# Patient Record
Sex: Female | Born: 1965 | Race: White | Hispanic: No | Marital: Married | Smoking: Never smoker
Health system: Southern US, Community
[De-identification: ages and names within clinical notes are randomized; demographics above are authoritative.]

---

## 2005-11-14 ENCOUNTER — Other Ambulatory Visit: Admission: RE | Admit: 2005-11-14 | Discharge: 2005-11-14 | Payer: Self-pay | Admitting: Obstetrics and Gynecology

## 2006-03-03 ENCOUNTER — Encounter: Admission: RE | Admit: 2006-03-03 | Discharge: 2006-03-03 | Payer: Self-pay | Admitting: Internal Medicine

## 2007-03-12 ENCOUNTER — Encounter: Admission: RE | Admit: 2007-03-12 | Discharge: 2007-03-12 | Payer: Self-pay | Admitting: Obstetrics and Gynecology

## 2008-03-19 ENCOUNTER — Encounter: Admission: RE | Admit: 2008-03-19 | Discharge: 2008-03-19 | Payer: Self-pay | Admitting: Obstetrics and Gynecology

## 2009-03-30 ENCOUNTER — Encounter: Admission: RE | Admit: 2009-03-30 | Discharge: 2009-03-30 | Payer: Self-pay | Admitting: Obstetrics and Gynecology

## 2010-04-15 ENCOUNTER — Encounter: Admission: RE | Admit: 2010-04-15 | Discharge: 2010-04-15 | Payer: Self-pay | Admitting: Obstetrics and Gynecology

## 2010-04-22 ENCOUNTER — Encounter: Admission: RE | Admit: 2010-04-22 | Discharge: 2010-04-22 | Payer: Self-pay | Admitting: Obstetrics and Gynecology

## 2011-04-18 ENCOUNTER — Other Ambulatory Visit: Payer: Self-pay | Admitting: Internal Medicine

## 2011-04-18 DIAGNOSIS — Z1231 Encounter for screening mammogram for malignant neoplasm of breast: Secondary | ICD-10-CM

## 2011-04-26 ENCOUNTER — Ambulatory Visit
Admission: RE | Admit: 2011-04-26 | Discharge: 2011-04-26 | Disposition: A | Discharge status: Home / Self Care | Payer: 59 | Source: Ambulatory Visit | Attending: Internal Medicine | Admitting: Internal Medicine

## 2011-04-26 DIAGNOSIS — Z1231 Encounter for screening mammogram for malignant neoplasm of breast: Secondary | ICD-10-CM

## 2011-10-25 ENCOUNTER — Other Ambulatory Visit: Payer: Self-pay

## 2012-05-14 ENCOUNTER — Other Ambulatory Visit: Payer: Self-pay | Admitting: Internal Medicine

## 2012-05-14 DIAGNOSIS — Z1231 Encounter for screening mammogram for malignant neoplasm of breast: Secondary | ICD-10-CM

## 2012-06-07 ENCOUNTER — Ambulatory Visit
Admission: RE | Admit: 2012-06-07 | Discharge: 2012-06-07 | Disposition: A | Discharge status: Home / Self Care | Payer: 59 | Source: Ambulatory Visit | Attending: Internal Medicine | Admitting: Internal Medicine

## 2012-06-07 DIAGNOSIS — Z1231 Encounter for screening mammogram for malignant neoplasm of breast: Secondary | ICD-10-CM

## 2012-08-27 ENCOUNTER — Other Ambulatory Visit (HOSPITAL_COMMUNITY): Payer: Self-pay | Admitting: Gastroenterology

## 2012-08-27 DIAGNOSIS — K859 Acute pancreatitis without necrosis or infection, unspecified: Secondary | ICD-10-CM

## 2012-08-28 ENCOUNTER — Ambulatory Visit (HOSPITAL_COMMUNITY): Payer: 59

## 2012-11-20 ENCOUNTER — Other Ambulatory Visit: Payer: Self-pay

## 2013-05-07 ENCOUNTER — Other Ambulatory Visit: Payer: Self-pay

## 2013-05-07 DIAGNOSIS — Z1231 Encounter for screening mammogram for malignant neoplasm of breast: Secondary | ICD-10-CM

## 2013-06-10 ENCOUNTER — Ambulatory Visit: Payer: 59

## 2013-06-11 ENCOUNTER — Ambulatory Visit: Payer: 59

## 2013-06-14 ENCOUNTER — Ambulatory Visit
Admission: RE | Admit: 2013-06-14 | Discharge: 2013-06-14 | Disposition: A | Discharge status: Home / Self Care | Payer: 59 | Source: Ambulatory Visit

## 2013-06-14 DIAGNOSIS — Z1231 Encounter for screening mammogram for malignant neoplasm of breast: Secondary | ICD-10-CM

## 2013-06-19 ENCOUNTER — Other Ambulatory Visit: Payer: Self-pay | Admitting: Internal Medicine

## 2013-06-19 DIAGNOSIS — R928 Other abnormal and inconclusive findings on diagnostic imaging of breast: Secondary | ICD-10-CM

## 2013-07-04 ENCOUNTER — Ambulatory Visit
Admission: RE | Admit: 2013-07-04 | Discharge: 2013-07-04 | Disposition: A | Discharge status: Home / Self Care | Payer: 59 | Source: Ambulatory Visit | Attending: Internal Medicine | Admitting: Internal Medicine

## 2013-07-04 DIAGNOSIS — R928 Other abnormal and inconclusive findings on diagnostic imaging of breast: Secondary | ICD-10-CM

## 2014-07-16 ENCOUNTER — Other Ambulatory Visit: Payer: Self-pay

## 2014-07-16 DIAGNOSIS — Z1231 Encounter for screening mammogram for malignant neoplasm of breast: Secondary | ICD-10-CM

## 2014-08-06 ENCOUNTER — Ambulatory Visit
Admission: RE | Admit: 2014-08-06 | Discharge: 2014-08-06 | Disposition: A | Discharge status: Home / Self Care | Payer: BC Managed Care – PPO | Source: Ambulatory Visit

## 2014-08-06 ENCOUNTER — Ambulatory Visit: Payer: 59

## 2014-08-06 DIAGNOSIS — Z1231 Encounter for screening mammogram for malignant neoplasm of breast: Secondary | ICD-10-CM

## 2015-09-11 ENCOUNTER — Other Ambulatory Visit: Payer: Self-pay

## 2015-09-11 DIAGNOSIS — Z1231 Encounter for screening mammogram for malignant neoplasm of breast: Secondary | ICD-10-CM

## 2015-10-05 ENCOUNTER — Ambulatory Visit
Admission: RE | Admit: 2015-10-05 | Discharge: 2015-10-05 | Disposition: A | Discharge status: Home / Self Care | Payer: BLUE CROSS/BLUE SHIELD | Source: Ambulatory Visit

## 2015-10-05 DIAGNOSIS — Z1231 Encounter for screening mammogram for malignant neoplasm of breast: Secondary | ICD-10-CM

## 2015-10-07 ENCOUNTER — Other Ambulatory Visit: Payer: Self-pay | Admitting: Internal Medicine

## 2015-10-07 DIAGNOSIS — R928 Other abnormal and inconclusive findings on diagnostic imaging of breast: Secondary | ICD-10-CM

## 2015-10-13 ENCOUNTER — Ambulatory Visit
Admission: RE | Admit: 2015-10-13 | Discharge: 2015-10-13 | Disposition: A | Discharge status: Home / Self Care | Payer: BLUE CROSS/BLUE SHIELD | Source: Ambulatory Visit | Attending: Internal Medicine | Admitting: Internal Medicine

## 2015-10-13 DIAGNOSIS — R928 Other abnormal and inconclusive findings on diagnostic imaging of breast: Secondary | ICD-10-CM

## 2017-08-28 IMAGING — MG MM DIAG BREAST TOMO UNI RIGHT
6 series · 6 of 18 positions shown · non-contrast
Comparison: Previous exam(s).

CLINICAL DATA: Screening recall for a possible right breast mass
and a possible distortion.

EXAM:
DIGITAL DIAGNOSTIC RIGHT MAMMOGRAM WITH 3D TOMOSYNTHESIS WITH CAD
ULTRASOUND RIGHT BREAST

[R MLO]
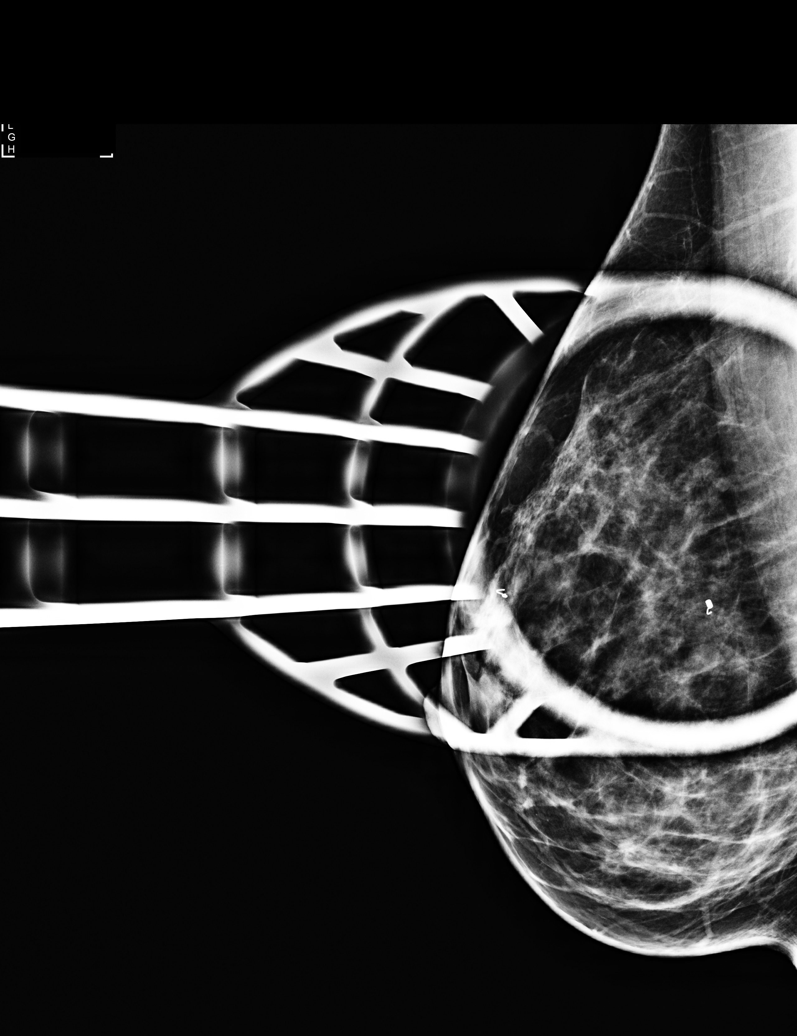

[R CC]
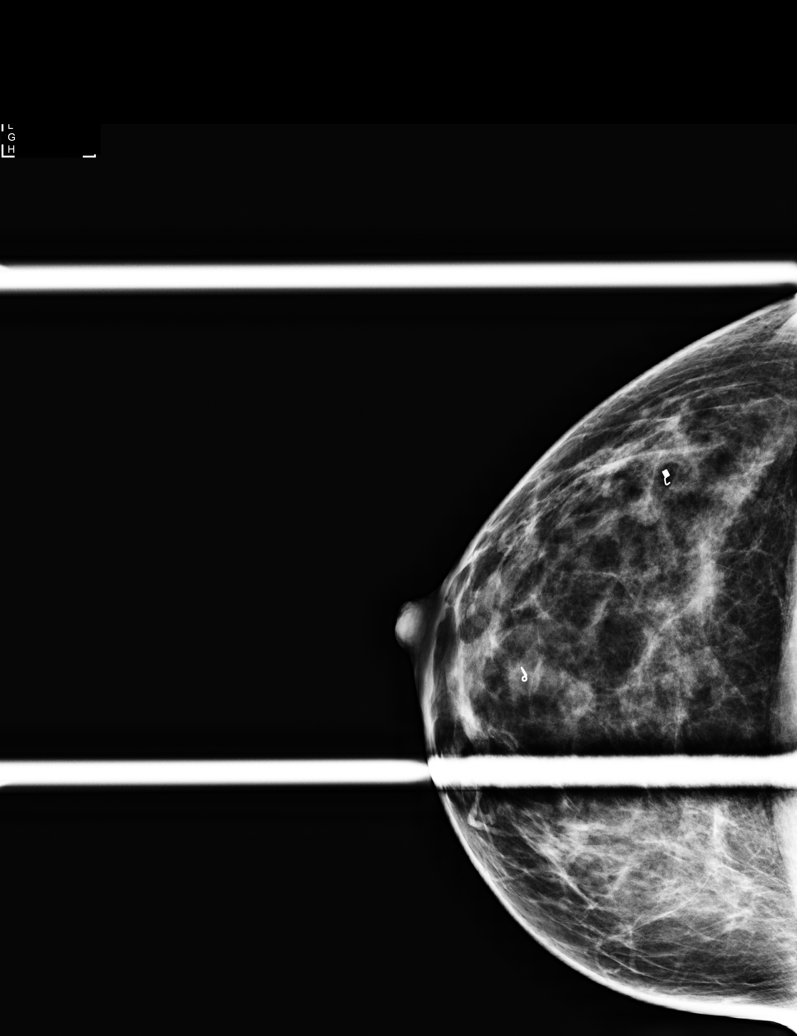

[R ML]
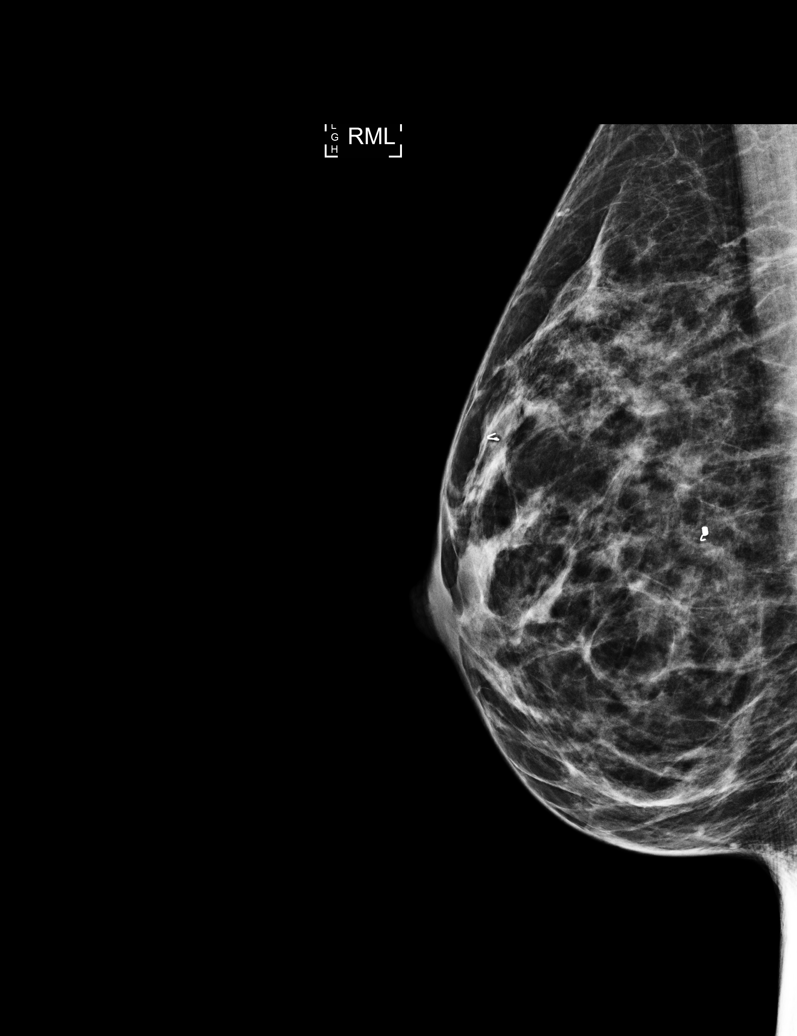

[R CC tomo · tomo slice 29/56.0]
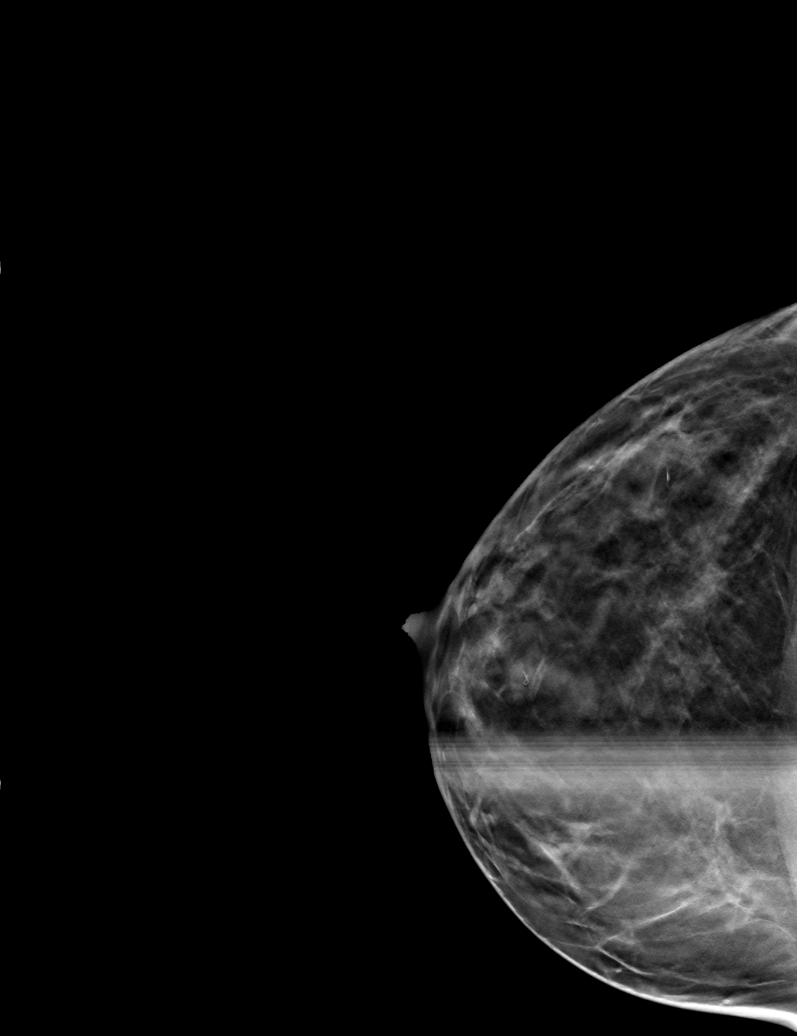

[R ML tomo · tomo slice 24/47.0]
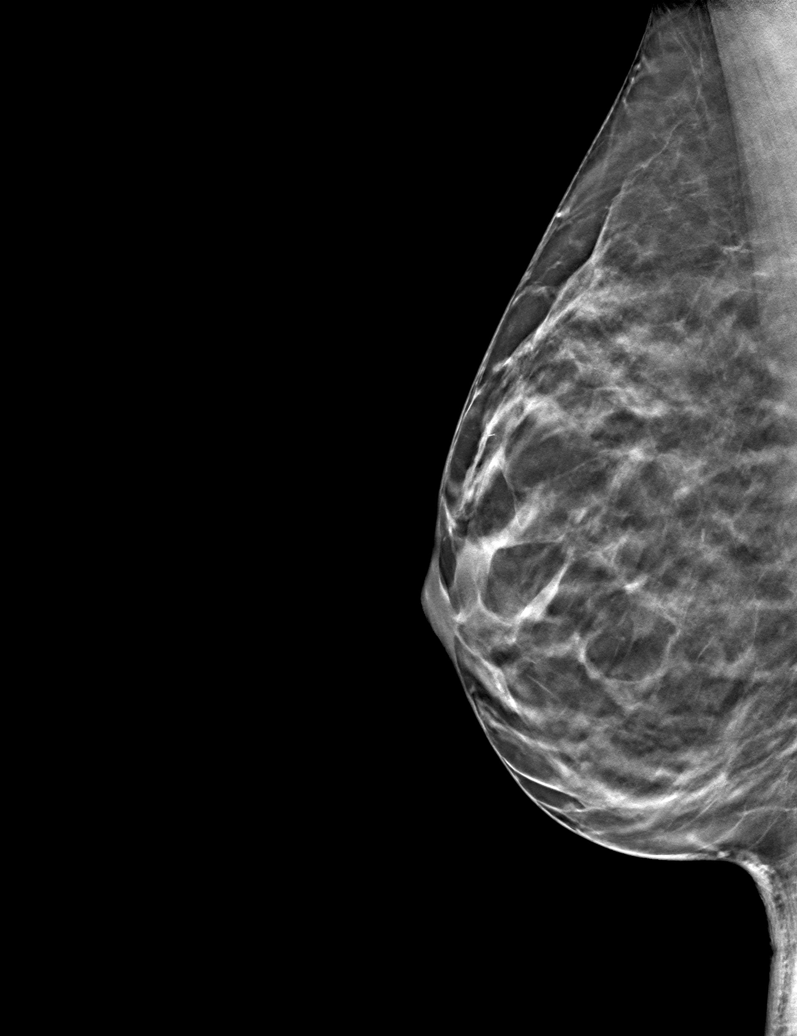

[R MLO tomo · tomo slice 27/53.0]
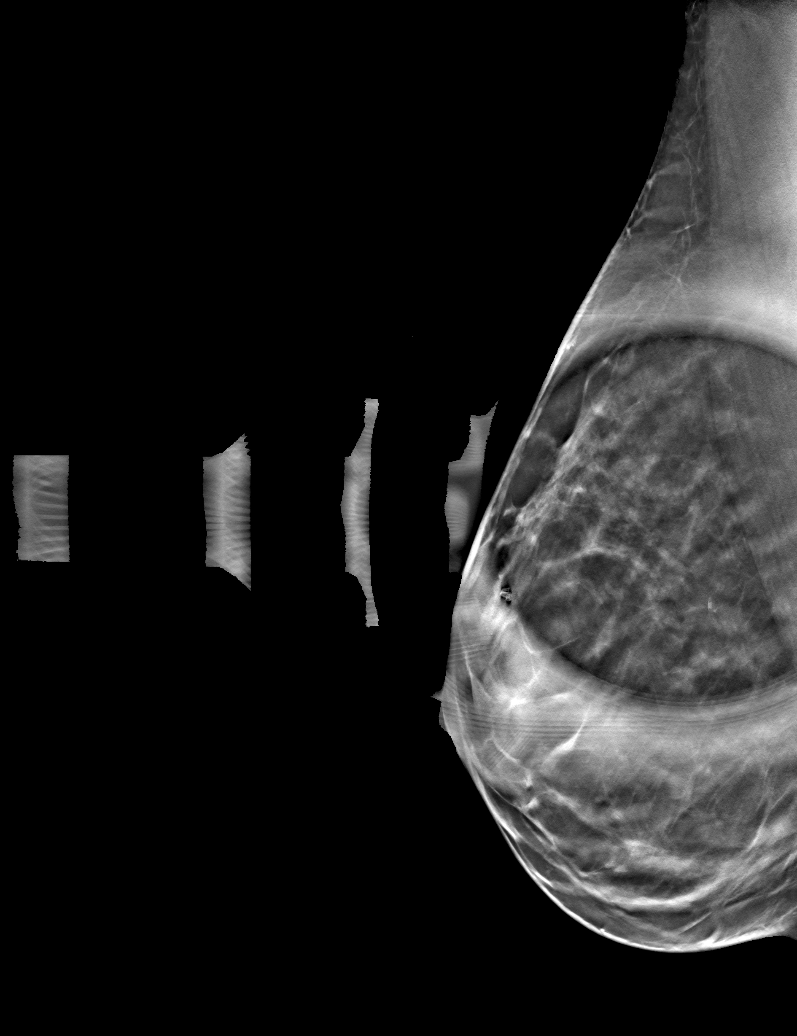

[6 of 18 positions shown; findings below may reference images not displayed]

ACR Breast Density Category c: The breast tissue is heterogeneously
dense, which may obscure small masses.
FINDINGS: On the spot compression tomosynthesis images, the possible
distortion appears to disperse. There are no convincing residual
converging lines. There is no evidence of distortion on the
spot-compression MLO view or true ML view.

There is, however, an oval smoothly marginated mass, relatively
lucent, that lies in the right breast upper outer quadrant adjacent
to a coil shaped biopsy clip.

Mammographic images were processed with CAD.

On physical exam, no mass is palpated in the upper outer right
breast.

Targeted ultrasound is performed, showing a simple oval
circumscribed cyst measuring 7 x 4 x 7 mm in the right breast at 9
o'clock 2 cm the nipple, corresponding to the mammographic mass.
There are no areas of sonographic architectural distortion. A
previously biopsied benign mass lies in the 12 o'clock position. No
other masses.
IMPRESSION: 1. Under right breast cysts.  No evidence of malignancy.

RECOMMENDATION:
Screening mammogram in one year.(Code:0D-F-TBC)

I have discussed the findings and recommendations with the patient.
Results were also provided in writing at the conclusion of the
visit. If applicable, a reminder letter will be sent to the patient
regarding the next appointment.

BI-RADS CATEGORY  2: Benign.

## 2017-12-05 DIAGNOSIS — M25511 Pain in right shoulder: Secondary | ICD-10-CM | POA: Insufficient documentation

## 2017-12-05 DIAGNOSIS — M19012 Primary osteoarthritis, left shoulder: Secondary | ICD-10-CM | POA: Insufficient documentation

## 2017-12-12 ENCOUNTER — Ambulatory Visit: Payer: BLUE CROSS/BLUE SHIELD | Admitting: Podiatry

## 2017-12-12 ENCOUNTER — Ambulatory Visit (INDEPENDENT_AMBULATORY_CARE_PROVIDER_SITE_OTHER): Payer: BLUE CROSS/BLUE SHIELD

## 2017-12-12 ENCOUNTER — Encounter: Payer: Self-pay | Admitting: Podiatry

## 2017-12-12 DIAGNOSIS — L603 Nail dystrophy: Secondary | ICD-10-CM

## 2017-12-12 DIAGNOSIS — B070 Plantar wart: Secondary | ICD-10-CM | POA: Diagnosis not present

## 2017-12-12 DIAGNOSIS — Q828 Other specified congenital malformations of skin: Secondary | ICD-10-CM | POA: Diagnosis not present

## 2017-12-12 DIAGNOSIS — M898X9 Other specified disorders of bone, unspecified site: Secondary | ICD-10-CM

## 2017-12-13 NOTE — Progress Notes (Signed)
  Subjective:  Patient ID: Mackenzie Miller, female    DOB: 08/17/1966,  MRN: [id number] HPI Chief Complaint  Patient presents with  . Nail Problem    Hallux bilateral - toenails thick and discolored x years, doc at [facility] advised removing toenails or removing the bone spurs underneath which xrays confirmed, also callused area sub 5th left     52 y.o. female presents with the above complaint.   Review of systems: Denies fever chills nausea vomiting muscle aches pains back pain chest pain shortness of breath headache.  No past medical history on file.   Current Outpatient Medications:  .  levothyroxine (SYNTHROID, LEVOTHROID) 50 MCG tablet, , Disp: , Rfl: 1  No Known Allergies Review of Systems Objective:  There were no vitals filed for this visit.  General: Well developed, nourished, in no acute distress, alert and oriented x3   Dermatological: Skin is warm, dry and supple bilateral. Nails x 10 are well maintained; remaining integument appears unremarkable at this time. There are no open sores, no preulcerative lesions, no rash or signs of infection present.  Her hallux nails demonstrate severe nail dystrophy they are black they are thickened there rolled under on the ends painful but loose from the nail bed.  There is no erythema cellulitis drainage or odor tenderness on palpation is noted.  She also has a 1 cm verrucoid lesion plantar aspect of the fifth metatarsal head of the left foot.  It appears to be flattened and fairly superficial.  Thrombosed capillaries are visible in skin lines circumvent the lesion.  Vascular: Dorsalis Pedis artery and Posterior Tibial artery pedal pulses are 2/4 bilateral with immedate capillary fill time. Pedal hair growth present. No varicosities and no lower extremity edema present bilateral.   Neruologic: Grossly intact via light touch bilateral. Vibratory intact via tuning fork bilateral. Protective threshold with Semmes Wienstein monofilament  intact to all pedal sites bilateral. Patellar and Achilles deep tendon reflexes 2+ bilateral. No Babinski or clonus noted bilateral.   Musculoskeletal: No gross boney pedal deformities bilateral. No pain, crepitus, or limitation noted with foot and ankle range of motion bilateral. Muscular strength 5/5 in all groups tested bilateral.  Gait: Unassisted, Nonantalgic.    Radiographs:  Radiographs taken today do not demonstrate any type of osseous lesions beneath the hallux bilaterally.  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  Plantar wart sub-fifth metatarsal head left foot.  Plan: Patient is leaving town for walking trip and would like to consider postponing the removal of the toenails as well as the wart until next visit.  She will make a follow-up visit 3 weeks from now.  I will follow-up with her at that time for a total matrixectomy hallux bilateral and either excision of wart or application of Cantharone at that time.     Kalilah Barua T. Mexico[place]att, [place] Dakota[place]PM

## 2017-12-28 ENCOUNTER — Encounter: Payer: Self-pay | Admitting: Podiatry

## 2017-12-28 ENCOUNTER — Ambulatory Visit: Payer: BLUE CROSS/BLUE SHIELD | Admitting: Podiatry

## 2017-12-28 DIAGNOSIS — L600 Ingrowing nail: Secondary | ICD-10-CM | POA: Diagnosis not present

## 2017-12-28 DIAGNOSIS — B070 Plantar wart: Secondary | ICD-10-CM | POA: Diagnosis not present

## 2017-12-28 MED ORDER — FLUOROURACIL 5 % EX CREA: TOPICAL_CREAM | Freq: Two times a day (BID) | CUTANEOUS | 1 refills | Status: DC

## 2017-12-28 MED ORDER — NEOMYCIN-POLYMYXIN-HC 1 % OT SOLN: OTIC | 1 refills | Status: DC

## 2017-12-28 NOTE — Progress Notes (Signed)
She presents today for follow-up of her painful ingrown toenails hallux bilaterally.  She is ready to have them removed today.  She also like [place] to take care of the painful lesion on the plantar aspect of the fifth metatarsal left foot.  She denies changes in her past medical history medications allergy surgeries and social history since I saw her last.  Objective: Vital signs are stable she is alert and oriented x3.  Pulses are palpable.  Neurologic sensorium is intact.  Deep tendon reflexes are intact.  Toenails are thick painful hallux bilaterally.  She also has a large verrucoid lesion measuring 2 cm plantar aspect of the fifth metatarsal head of the left foot with considerable fat atrophy in the area.  There is no open lesions or wounds noted.  Assessment: Verruca plantaris sub-fifth metatarsal head of the left foot.  Painful ingrown toenails hallux bilateral.  Plan: Discussed etiology pathology conservative versus surgical therapies at this point in time we performed a total matrixectomy's of the hallux bilateral which was performed after 3 cc of a 50-50 mixture of Marcaine plain lidocaine plain were infiltrated in a hallux block bilaterally.  She tolerated the nail avulsion and matricectomy is very well dressed sterile dressings were applied she was given both oral and written home-going instructions for the care and soaking of her toe as well as a prescription for Cortisporin Otic to be applied twice daily after soaking.  Chemical destruction of the lesion with Cantharone under occlusion was performed today fifth metatarsal of the left foot.  A light bandage was placed which she will remove tomorrow and will wash off the acid thoroughly.  She will stop the use of Efudex cream which is sent over to the pharmacy today.  She will apply this twice daily after soaking just to the verrucoid lesion.  I will follow-up with her in the near future for evaluation of her matrixectomy's.  And evaluation of the  verruca.

## 2017-12-28 NOTE — Patient Instructions (Signed)

## 2018-01-18 ENCOUNTER — Ambulatory Visit: Payer: BLUE CROSS/BLUE SHIELD | Admitting: Podiatry

## 2018-01-18 ENCOUNTER — Encounter: Payer: Self-pay | Admitting: Podiatry

## 2018-01-18 DIAGNOSIS — L600 Ingrowing nail: Secondary | ICD-10-CM

## 2018-01-18 NOTE — Progress Notes (Signed)
She presents today for follow-up of her nail procedures hallux bilateral.  She states that they are doing just fine no problems and is post total nail avulsions matrixectomy's.  She states that the wart seems to be going away as well continues to use the Efudex cream.  Objective: Vital signs stable alert oriented x3 nailbeds appear to be healing uneventfully with epithelialization occurring there is some drainage still present.  But it does not appear to be purulent in nature.  The wart to the sub-fifth metatarsal head of the left foot appears to be resolving.  Assessment: Resolving verruca plantaris fifth metatarsal head left.  Well-healing nail avulsion hallux bilateral.  Plan: At this point encouraged her to continue the use of the Efudex cream and I will follow-up with her on an as-needed basis should her toes not going to heal with the next 2 weeks she will notify us immediately.

## 2018-01-18 NOTE — Patient Instructions (Signed)

## 2020-01-07 ENCOUNTER — Ambulatory Visit: Payer: 59 | Admitting: Podiatry

## 2020-01-07 ENCOUNTER — Other Ambulatory Visit: Payer: Self-pay

## 2020-01-07 ENCOUNTER — Encounter: Payer: Self-pay | Admitting: Podiatry

## 2020-01-07 VITALS — Temp 96.6°F

## 2020-01-07 DIAGNOSIS — L600 Ingrowing nail: Secondary | ICD-10-CM | POA: Diagnosis not present

## 2020-01-07 DIAGNOSIS — B070 Plantar wart: Secondary | ICD-10-CM

## 2020-01-07 MED ORDER — FLUOROURACIL 5 % EX CREA: TOPICAL_CREAM | Freq: Two times a day (BID) | CUTANEOUS | 1 refills | Status: AC

## 2020-01-07 MED ORDER — NEOMYCIN-POLYMYXIN-HC 1 % OT SOLN: OTIC | 1 refills | Status: AC

## 2020-01-07 NOTE — Progress Notes (Signed)
She presents today after having not seen her for couple of years with a chief concern of a painful nail second digit of the left foot.  She states that I would like to have this nail removed just like we remove the nails on both great toes.  She states that still have this wart over here on the fifth metatarsal area she points to the subfifth metatarsal of the left foot.  She states that it is tender just as a 1 to go away.  Has not really changed that much but is not improving.  Objective: Vital signs are stable she is alert oriented x3 pulses are palpable.  Neurologic sensorium is intact deep tendon reflexes are intact deep tendon reflexes are intact muscle strength is normal symmetrical.  Second digit of the left foot demonstrates severe dystrophic nail with pain on palpation and attempted examination.  She also has a 0.5 x 1.0 cm verrucoid lesion angularly oriented over the plantar lateral aspect of the fifth metatarsal head left foot.  Skin lines circumvent the lesion lesion and thrombosed capillaries are visible.  Assessment: Painful nail second digit left foot.  Verruca plantaris left foot.  Plan: Discussed etiology pathology and surgical therapies at this point in time went ahead and performed a total nail avulsion chemical matrixectomy after local anesthesia was administered.  She was provided with oral and home instructions for the care and soaking of the toe as well as a prescription for Corticosporin otic to be applied twice daily after soaking.  I will follow-up with her in 2 to 3 weeks.  I would also like for her to get started utilizing 5-fluorouracil on the lesion twice daily after sending it down with a disposable emery board.  She will do this at least once weekly.  If this is not resolving within 6 weeks then we will consider numbing the area and removing the lesion via curettage.

## 2020-01-07 NOTE — Patient Instructions (Signed)

## 2020-01-23 ENCOUNTER — Other Ambulatory Visit: Payer: Self-pay

## 2020-01-23 ENCOUNTER — Encounter: Payer: Self-pay | Admitting: Podiatry

## 2020-01-23 ENCOUNTER — Ambulatory Visit (INDEPENDENT_AMBULATORY_CARE_PROVIDER_SITE_OTHER): Payer: 59 | Admitting: Podiatry

## 2020-01-23 DIAGNOSIS — B070 Plantar wart: Secondary | ICD-10-CM | POA: Diagnosis not present

## 2020-01-23 DIAGNOSIS — Z9889 Other specified postprocedural states: Secondary | ICD-10-CM

## 2020-01-23 DIAGNOSIS — L600 Ingrowing nail: Secondary | ICD-10-CM

## 2020-01-23 NOTE — Progress Notes (Signed)
She presents today for follow-up of her matrixectomy to the second toe of the left foot.  States it is a little red and sore but I am still continuing to soak it and it seems to be doing just fine.  I continue to place the Efudex cream on the wart on the plantar part of the foot but it just really does not seem to be doing very much I continue to follow down on a regular basis but the medication on there twice a day with a Band-Aid.  She denies any problems doing this denies fever chills nausea vomiting muscle aches pains calf pain back pain chest pain shortness of breath.  Objective: Vital signs are stable alert and oriented x3 the toe demonstrates no level of erythema no cellulitis drainage or odor it appears to be healing perfectly.  Evaluation of the wart on the plantar lateral aspect of the fifth metatarsal head left does demonstrate thinning of the wart possibly associated with the medication or her debridement.  Nonetheless it appears to be thinner the thrombosed capillaries are much more superficial she still has tenderness of medial lateral compression of the lesion still measures 0.5 x 1.0 cm in diameter.  Assessment: Well-healing surgical toe second left.  Verruca plantaris plantar aspect left foot.  Plan: Discussed etiology pathology conservative versus surgical therapies at this point in time went ahead and recommended that she continue to soak for at least another week Epson salts and warm water cover during the daytime leave open at bedtime.  She will continue the Efudex cream twice a day cover with a Band-Aid and I will follow-up with her in 6 weeks.  Remember to ask her how her trip to [place] went for her daughter's graduation.

## 2020-08-17 LAB — EXTERNAL GENERIC LAB PROCEDURE: COLOGUARD: NEGATIVE

## 2020-08-17 LAB — COLOGUARD: COLOGUARD: NEGATIVE

## 2022-09-19 DIAGNOSIS — E039 Hypothyroidism, unspecified: Secondary | ICD-10-CM | POA: Diagnosis not present

## 2022-09-27 DIAGNOSIS — Z0000 Encounter for general adult medical examination without abnormal findings: Secondary | ICD-10-CM | POA: Diagnosis not present

## 2022-09-27 DIAGNOSIS — D682 Hereditary deficiency of other clotting factors: Secondary | ICD-10-CM | POA: Diagnosis not present

## 2022-09-27 DIAGNOSIS — R82998 Other abnormal findings in urine: Secondary | ICD-10-CM | POA: Diagnosis not present

## 2022-09-27 DIAGNOSIS — Z23 Encounter for immunization: Secondary | ICD-10-CM | POA: Diagnosis not present

## 2022-10-12 DIAGNOSIS — Z01419 Encounter for gynecological examination (general) (routine) without abnormal findings: Secondary | ICD-10-CM | POA: Diagnosis not present

## 2022-10-12 DIAGNOSIS — Z6823 Body mass index (BMI) 23.0-23.9, adult: Secondary | ICD-10-CM | POA: Diagnosis not present

## 2022-10-12 DIAGNOSIS — Z1231 Encounter for screening mammogram for malignant neoplasm of breast: Secondary | ICD-10-CM | POA: Diagnosis not present

## 2023-09-14 ENCOUNTER — Ambulatory Visit (INDEPENDENT_AMBULATORY_CARE_PROVIDER_SITE_OTHER): Payer: 59 | Admitting: Podiatry

## 2023-09-14 ENCOUNTER — Encounter: Payer: Self-pay | Admitting: Podiatry

## 2023-09-14 DIAGNOSIS — M7662 Achilles tendinitis, left leg: Secondary | ICD-10-CM | POA: Diagnosis not present

## 2023-09-14 NOTE — Progress Notes (Signed)
  Subjective:  Patient ID: Mackenzie Miller, female    DOB: Aug 08, 1966,  MRN: [id number]  Chief Complaint  Patient presents with   Foot Pain    Pt is here due to left heel pain, states she has a previous achilles injury in the same foot, but other than that no injury to the foot states pain started over a week ago.     58 y.o. female presents with the above complaint.  Patient presents with left Achilles tendon insertional pain.  Patient states painful to touch as well as gotten worse worse with ambulation shoe pressure she said previously previous injuries.  She wanted to get evaluated start about a weeks ago.  Has not seen MRIs prior to seeing me   Review of Systems: Negative except as noted in the HPI. Denies N/V/F/Ch.  No past medical history on file.  Current Outpatient Medications:    fluorouracil (EFUDEX) 5 % cream, Apply topically 2 (two) times daily., Disp: 40 g, Rfl: 1   levothyroxine (SYNTHROID, LEVOTHROID) 50 MCG tablet, , Disp: , Rfl: 1   Multiple Vitamin (MULTIVITAMIN) tablet, Take 1 tablet by mouth daily., Disp: , Rfl:    NEOMYCIN-POLYMYXIN-HYDROCORTISONE (CORTISPORIN) 1 % SOLN OTIC solution, Apply 1-2 drops to toe BID after soaking, Disp: 10 mL, Rfl: 1  Social History   Tobacco Use  Smoking Status Never  Smokeless Tobacco Never    No Known Allergies Objective:  There were no vitals filed for this visit. There is no height or weight on file to calculate BMI. Constitutional Well developed. Well nourished.  Vascular Dorsalis pedis pulses palpable bilaterally. Posterior tibial pulses palpable bilaterally. Capillary refill normal to all digits.  No cyanosis or clubbing noted. Pedal hair growth normal.  Neurologic Normal speech. Oriented to person, place, and time. Epicritic sensation to light touch grossly present bilaterally.  Dermatologic Nails well groomed and normal in appearance. No open wounds. No skin lesions.  Orthopedic: Pain on palpation left  Achilles tendon insertion pain with dorsiflexion of the ankle joint no pain with plantarflexion of the ankle joint positive Haglund's deformity noted positive Silfverskiold test noted with gastrocnemius equinus.  No pain at the posterior tibial tendon peroneal tendon ATFL ligament   Radiographs: None Assessment:   1. Left Achilles tendinitis    Plan:  Patient was evaluated and treated and all questions answered.  Achilles tendinitis left -All questions and concerns were discussed with the patient in extensive detail -I explained to the patient the etiology of Achilles tendinitis and various treatment options were discussed.  Given the amount of pain that she is experiencing benefit from cam boot immobilization Cam boot was given to the patient -If there is no improvement we discussed MRI versus steroid injection No follow-ups on file.

## 2023-10-13 ENCOUNTER — Ambulatory Visit: Payer: 59 | Admitting: Podiatry

## 2023-10-13 ENCOUNTER — Encounter: Payer: Self-pay | Admitting: Podiatry

## 2023-10-13 DIAGNOSIS — Q6671 Congenital pes cavus, right foot: Secondary | ICD-10-CM | POA: Diagnosis not present

## 2023-10-13 DIAGNOSIS — Q6672 Congenital pes cavus, left foot: Secondary | ICD-10-CM

## 2023-10-13 DIAGNOSIS — Q667 Congenital pes cavus, unspecified foot: Secondary | ICD-10-CM

## 2023-10-13 DIAGNOSIS — M7662 Achilles tendinitis, left leg: Secondary | ICD-10-CM

## 2023-10-13 NOTE — Progress Notes (Signed)
  Subjective:  Patient ID: Mackenzie Miller, female    DOB: 07-02-1966,  MRN: [id number]  Chief Complaint  Patient presents with   Foot Pain    Pt is here to f/u on left foot and achilles pain, she states that wearing the boot has help a lot especially how it takes pressure off the heel, not 100% better but she can tell the difference. No other complaints.    58 y.o. female presents with the above complaint.  Patient presents with left Achilles tendon insertional pain.  Patient states painful to touch as well as gotten worse worse with ambulation shoe pressure she said previously previous injuries.  She wanted to get evaluated start about a weeks ago.  Has not seen MRIs prior to seeing me   Review of Systems: Negative except as noted in the HPI. Denies N/V/F/Ch.  No past medical history on file.  Current Outpatient Medications:    fluorouracil (EFUDEX) 5 % cream, Apply topically 2 (two) times daily., Disp: 40 g, Rfl: 1   levothyroxine (SYNTHROID, LEVOTHROID) 50 MCG tablet, , Disp: , Rfl: 1   Multiple Vitamin (MULTIVITAMIN) tablet, Take 1 tablet by mouth daily., Disp: , Rfl:    NEOMYCIN-POLYMYXIN-HYDROCORTISONE (CORTISPORIN) 1 % SOLN OTIC solution, Apply 1-2 drops to toe BID after soaking, Disp: 10 mL, Rfl: 1  Social History   Tobacco Use  Smoking Status Never  Smokeless Tobacco Never    No Known Allergies Objective:  There were no vitals filed for this visit. There is no height or weight on file to calculate BMI. Constitutional Well developed. Well nourished.  Vascular Dorsalis pedis pulses palpable bilaterally. Posterior tibial pulses palpable bilaterally. Capillary refill normal to all digits.  No cyanosis or clubbing noted. Pedal hair growth normal.  Neurologic Normal speech. Oriented to person, place, and time. Epicritic sensation to light touch grossly present bilaterally.  Dermatologic Nails well groomed and normal in appearance. No open wounds. No skin lesions.   Orthopedic: Pain on palpation left Achilles tendon insertion pain with dorsiflexion of the ankle joint no pain with plantarflexion of the ankle joint positive Haglund's deformity noted positive Silfverskiold test noted with gastrocnemius equinus.  No pain at the posterior tibial tendon peroneal tendon ATFL ligament   Radiographs: None Assessment:   1. Left Achilles tendinitis   2. Pes cavus     Plan:  Patient was evaluated and treated and all questions answered.  Achilles tendinitis left -All questions and concerns were discussed with the patient in extensive detail -Clinically pain improved considerably she can transition out of cam boot into regular shoes with Tri-Lock ankle brace.  Patient already has 1 -Patient will benefit from steroid injection because of inflammatory complaints of shoulder pain.  Patient agrees with plan to proceed with steroid injection -A steroid injection was performed at left Kager's fat pad using 1% plain Lidocaine and 10 mg of Kenalog. This was well tolerated.  Pes cavus -I explained to patient the etiology of pes cavus and relationship with Planter fasciitis and various treatment options were discussed.  Given patient foot structure in the setting of Planter fasciitis I believe patient will benefit from custom-made orthotics to help control the hindfoot motion support the arch of the foot and take the stress away from plantar fascial.  Patient agrees with the plan like to proceed with orthotics -Patient was casted for orthotics  No follow-ups on file.

## 2024-03-05 ENCOUNTER — Telehealth: Payer: Self-pay

## 2024-03-05 NOTE — Telephone Encounter (Signed)
 Called to schedule orthotic pu.. pt stated she called 3 months ago to cancel the order and said she does not want them  Balance: $542.00

## 2024-03-07 LAB — COLOGUARD: COLOGUARD: NEGATIVE
